# Patient Record
Sex: Female | Born: 1970 | Race: Black or African American | Hispanic: No | Marital: Married | Smoking: Never smoker
Health system: Southern US, Community
[De-identification: ages and names within clinical notes are randomized; demographics above are authoritative.]

## PROBLEM LIST (undated history)

## (undated) DIAGNOSIS — I10 Essential (primary) hypertension: Secondary | ICD-10-CM

## (undated) DIAGNOSIS — H5500 Unspecified nystagmus: Secondary | ICD-10-CM

## (undated) HISTORY — PX: EYE SURGERY: SHX253

---

## 2018-06-02 ENCOUNTER — Encounter (HOSPITAL_BASED_OUTPATIENT_CLINIC_OR_DEPARTMENT_OTHER): Payer: Self-pay | Admitting: Emergency Medicine

## 2018-06-02 ENCOUNTER — Emergency Department (HOSPITAL_BASED_OUTPATIENT_CLINIC_OR_DEPARTMENT_OTHER)
Admission: EM | Admit: 2018-06-02 | Discharge: 2018-06-02 | Disposition: A | Discharge status: Home / Self Care | Payer: Managed Care, Other (non HMO) | Attending: Emergency Medicine | Admitting: Emergency Medicine

## 2018-06-02 ENCOUNTER — Other Ambulatory Visit: Payer: Self-pay

## 2018-06-02 ENCOUNTER — Emergency Department (HOSPITAL_BASED_OUTPATIENT_CLINIC_OR_DEPARTMENT_OTHER): Payer: Managed Care, Other (non HMO)

## 2018-06-02 DIAGNOSIS — W101XXA Fall (on)(from) sidewalk curb, initial encounter: Secondary | ICD-10-CM | POA: Insufficient documentation

## 2018-06-02 DIAGNOSIS — S20211A Contusion of right front wall of thorax, initial encounter: Secondary | ICD-10-CM | POA: Insufficient documentation

## 2018-06-02 DIAGNOSIS — Y999 Unspecified external cause status: Secondary | ICD-10-CM | POA: Diagnosis not present

## 2018-06-02 DIAGNOSIS — I10 Essential (primary) hypertension: Secondary | ICD-10-CM | POA: Insufficient documentation

## 2018-06-02 DIAGNOSIS — Y9301 Activity, walking, marching and hiking: Secondary | ICD-10-CM | POA: Insufficient documentation

## 2018-06-02 DIAGNOSIS — Y9259 Other trade areas as the place of occurrence of the external cause: Secondary | ICD-10-CM | POA: Diagnosis not present

## 2018-06-02 HISTORY — DX: Unspecified nystagmus: H55.00

## 2018-06-02 HISTORY — DX: Essential (primary) hypertension: I10

## 2018-06-02 MED ORDER — METHOCARBAMOL 500 MG PO TABS: 500.0000 mg | ORAL_TABLET | Freq: Two times a day (BID) | ORAL | 0 refills | Status: AC

## 2018-06-02 MED ORDER — KETOROLAC TROMETHAMINE 30 MG/ML IJ SOLN
30.0000 mg | Freq: Once | INTRAMUSCULAR | Status: AC
  Administered 2018-06-02: 30 mg via INTRAMUSCULAR
  Filled 2018-06-02: qty 1

## 2018-06-02 MED ORDER — NAPROXEN 500 MG PO TABS: 500.0000 mg | ORAL_TABLET | Freq: Two times a day (BID) | ORAL | 0 refills | Status: AC

## 2018-06-02 NOTE — Discharge Instructions (Addendum)
Use ice 3-4 times daily alternating 20 minutes on, 20 minutes off.  Take Naprosyn twice daily as prescribed.  You can alternate with Tylenol as prescribed over-the-counter, as needed for your pain.  Take Robaxin twice daily as needed for muscle pain or spasms.  Do not drive or operate machinery while taking this medication.  Please follow-up with your doctor on returning home if your symptoms are not improving.  Please return the emergency department if you develop any new or worsening symptoms including significant bruising or pain in your abdomen or obvious blood in your urine.

## 2018-06-02 NOTE — ED Triage Notes (Signed)
Patient was at mall and missed curb and fell flat on her stomach and chest. Patient states she had on an undergarment that pressed into her lower ribs on the right side and has both internal and external pain in that area. No other symptoms are present.

## 2018-06-02 NOTE — ED Provider Notes (Signed)
[facility] EMERGENCY DEPARTMENT Provider Note   CSN: [id number] Arrival date & time: 06/02/18  1825     History   Chief Complaint Chief Complaint  Patient presents with  . Fall    HPI Regina Ford is a 47 y.o. female with history of hypertension who presents with right rib pain after fall.  Patient reports she tripped and [place] the mall.  She did not hit her head or lose consciousness.  She ended up lying on her chest and abdomen.  She has pain under her right breast when she moves.  She denies any pain without moving or at rest.  She denies any hematuria or abdominal pain at rest.  She denies any nausea, vomiting.  She is not anticoagulated.  She did not take any medication prior to arrival.  HPI  Past Medical History:  Diagnosis Date  . Hypertension   . Nystagmus     There are no active problems to display for this patient.   Past Surgical History:  Procedure Laterality Date  . EYE SURGERY       OB History    Gravida  2   Para  2   Term  2   Preterm      AB      Living  2     SAB      TAB      Ectopic      Multiple      Live Births  2            Home Medications    Prior to Admission medications   Medication Sig Start Date End Date Taking? Authorizing Provider  methocarbamol (ROBAXIN) 500 MG tablet Take 1 tablet (500 mg total) by mouth 2 (two) times daily. 06/02/18   Marciano Mundt, Waylan Boga, PA-C  naproxen (NAPROSYN) 500 MG tablet Take 1 tablet (500 mg total) by mouth 2 (two) times daily. 06/02/18   Emi Holes, PA-C    Family History History reviewed. No pertinent family history.  Social History Social History   Tobacco Use  . Smoking status: Never Smoker  . Smokeless tobacco: Never Used  Substance Use Topics  . Alcohol use: Never    Frequency: Never  . Drug use: Never     Allergies   Patient has no known allergies.   Review of Systems Review of Systems  Constitutional: Negative for chills and  fever.  HENT: Negative for facial swelling and sore throat.   Respiratory: Negative for shortness of breath.   Cardiovascular: Negative for chest pain.  Gastrointestinal: Negative for abdominal pain, nausea and vomiting.  Genitourinary: Negative for hematuria.  Musculoskeletal: Positive for myalgias. Negative for back pain and neck pain.  Skin: Negative for rash and wound.  Neurological: Negative for headaches.  Psychiatric/Behavioral: The patient is not nervous/anxious.      Physical Exam Updated Vital Signs BP (!) 151/94 (BP Location: Left Arm)   Pulse 100   Temp 98 F (36.7 C) (Oral)   Resp 19   Ht 5\' 8"  (1.727 m)   Wt 122.5 kg   LMP  (LMP Unknown)   SpO2 100%   BMI 41.05 kg/m   Physical Exam  Constitutional: She appears well-developed and well-nourished. No distress.  HENT:  Head: Normocephalic and atraumatic.  Mouth/Throat: Oropharynx is clear and moist. No oropharyngeal exudate.  Eyes: Pupils are equal, round, and reactive to light. Conjunctivae are normal. Right eye exhibits no discharge. Left eye  exhibits no discharge. No scleral icterus.  Neck: Normal range of motion. Neck supple. No thyromegaly present.  Cardiovascular: Normal rate, regular rhythm, normal heart sounds and intact distal pulses. Exam reveals no gallop and no friction rub.  No murmur heard. Pulmonary/Chest: Effort normal and breath sounds normal. No stridor. No respiratory distress. She has no wheezes. She has no rales. She exhibits tenderness.  Point tenderness to lowest rib No ecchymosis    Abdominal: Soft. Bowel sounds are normal. She exhibits no distension. There is no tenderness. There is no rebound and no guarding.  No ecchymosis  Musculoskeletal: She exhibits no edema.  No midline cervical, thoracic, or lumbar tenderness  Lymphadenopathy:    She has no cervical adenopathy.  Neurological: She is alert. Coordination normal.  Equal bilateral grip strength, sensation intact bilaterally, 5/5  strength to all 4 extremity  Skin: Skin is warm and dry. No rash noted. She is not diaphoretic. No pallor.  Psychiatric: She has a normal mood and affect.  Nursing note and vitals reviewed.    ED Treatments / Results  Labs (all labs ordered are listed, but only abnormal results are displayed) Labs Reviewed - No data to display  EKG None  Radiology Dg Ribs Unilateral W/chest Right  Result Date: 06/02/2018 CLINICAL DATA:  Fall.  Right rib pain. EXAM: RIGHT RIBS AND CHEST - 3+ VIEW COMPARISON:  None. FINDINGS: No fracture or other bone lesions are seen involving the ribs. There is no evidence of pneumothorax or pleural effusion. Both lungs are clear. Heart size and mediastinal contours are within normal limits. IMPRESSION: Negative. Electronically Signed   By: Gerome Sam III M.D   On: 06/02/2018 19:35    Procedures Procedures (including critical care time)  Medications Ordered in ED Medications  ketorolac (TORADOL) 30 MG/ML injection 30 mg (30 mg Intramuscular Given 06/02/18 1952)     Initial Impression / Assessment and Plan / ED Course  I have reviewed the triage vital signs and the nursing notes.  Pertinent labs & imaging results that were available during my care of the patient were reviewed by me and considered in my medical decision making (see chart for details).     Patient presenting with right lower rib pain after fall.  Right rib x-ray is negative.  Patient has no chest pain or shortness of breath.  Her pain is worse only with movement.  She has no abdominal tenderness or ecchymosis.  Will cover with incentive spirometer.  Supportive treatment including ice, Naprosyn, Robaxin advised.  Toradol given in the ED.  Follow-up to PCP as needed.  Strict return precautions discussed.  Patient understands and agrees with plan.  Vitals stable throughout ED course and discharged in satisfactory condition.  Final Clinical Impressions(s) / ED Diagnoses   Final diagnoses:    Contusion of rib on right side, initial encounter    ED Discharge Orders         Ordered    naproxen (NAPROSYN) 500 MG tablet  2 times daily     06/02/18 1946    methocarbamol (ROBAXIN) 500 MG tablet  2 times daily     06/02/18 1946           Emi Holes, PA-C 06/02/18 2025    Gwyneth Sprout, MD 06/03/18 2156

## 2019-12-04 IMAGING — CR DG RIBS W/ CHEST 3+V*R*
3 series · 3 of 3 positions shown · non-contrast
Comparison: None.

CLINICAL DATA: Fall.  Right rib pain.

EXAM:
RIGHT RIBS AND CHEST - 3+ VIEW

[w chest pa]
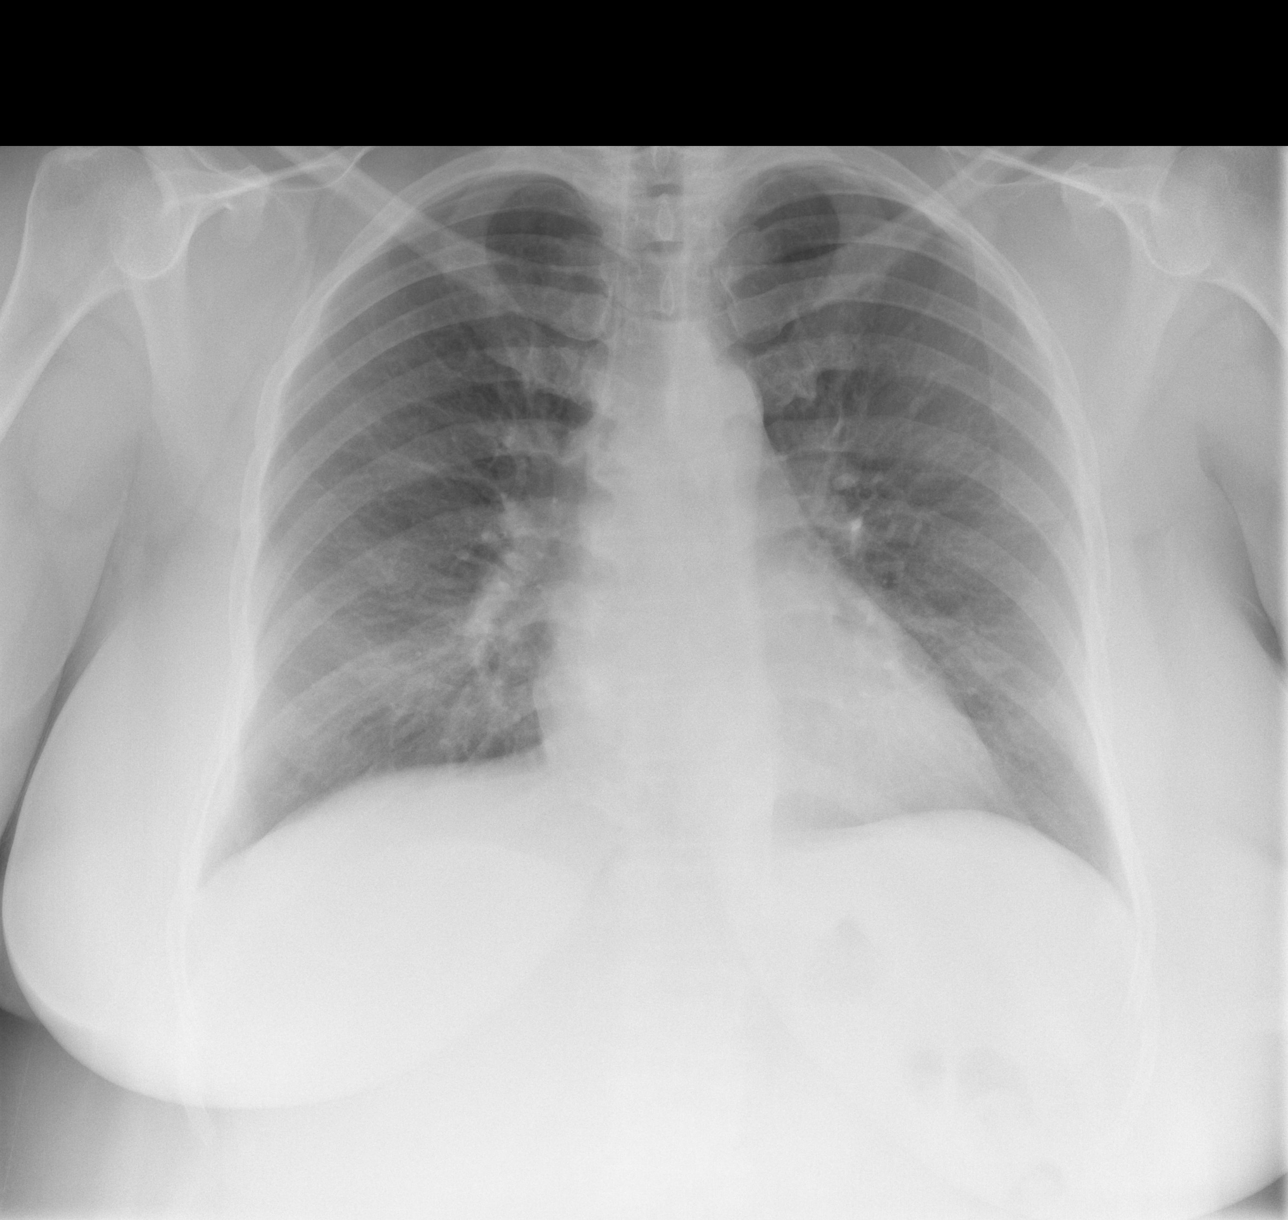

[w ribs ap/pa upper right]
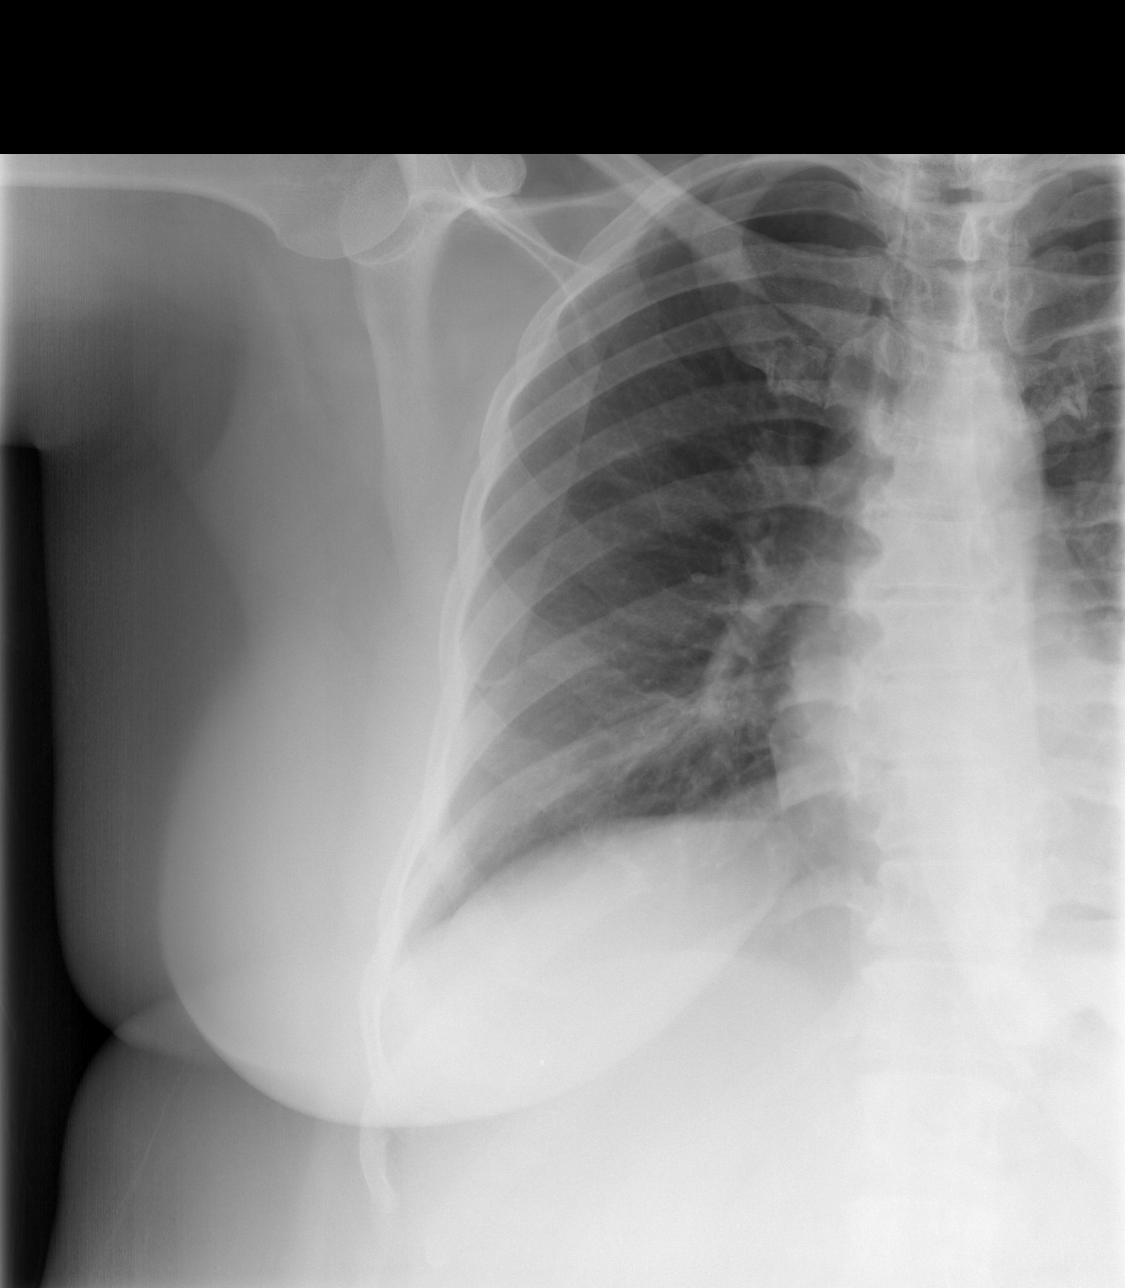

[w ribs oblique right]
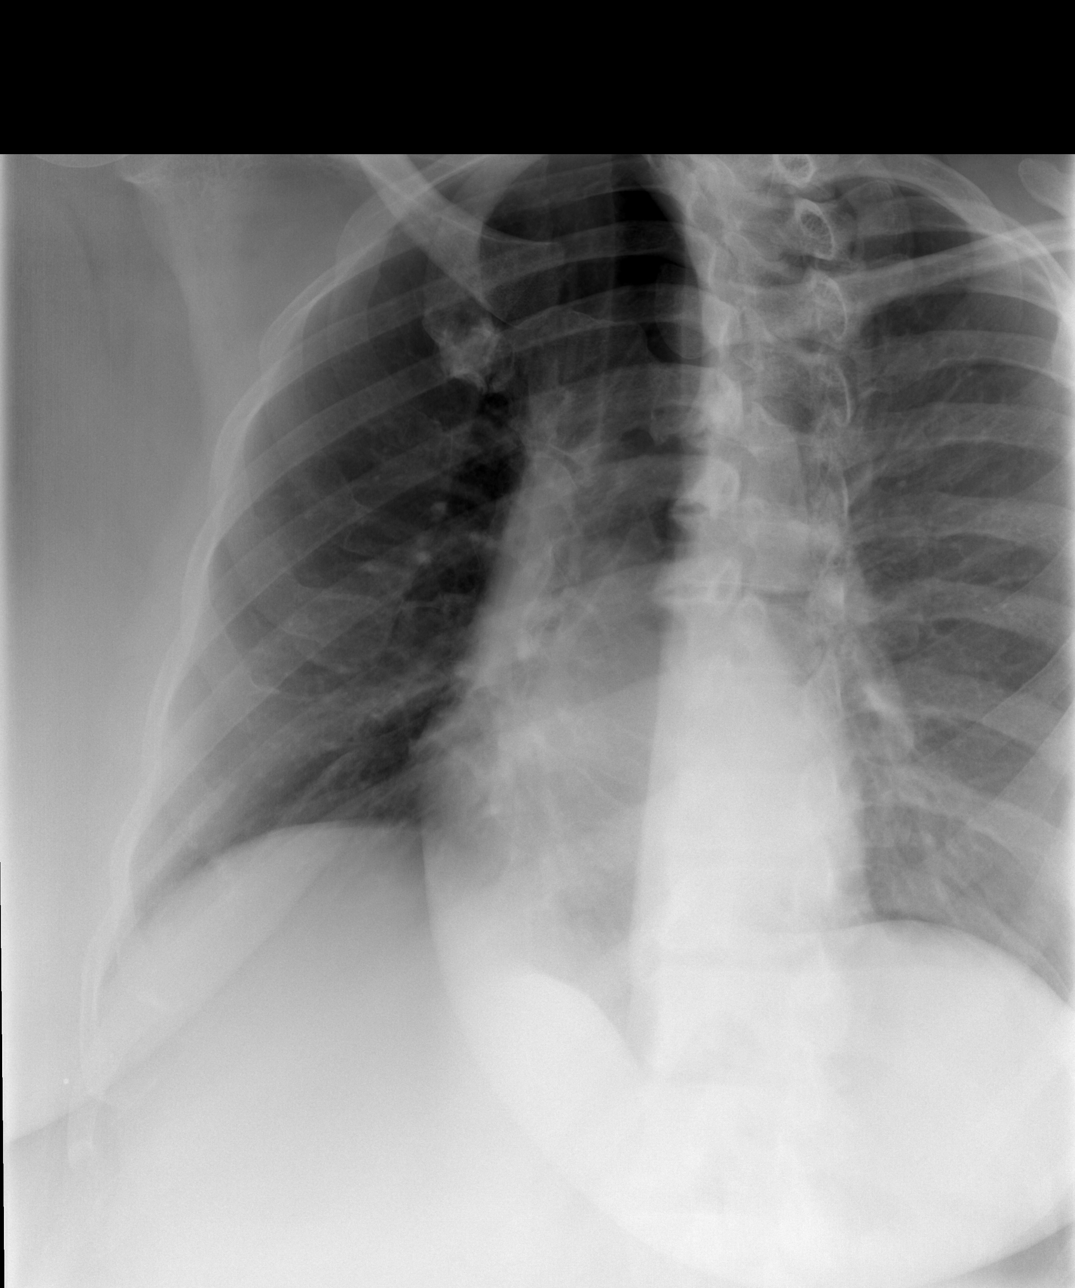

[3 of 3 positions shown; findings below may reference images not displayed]

FINDINGS: No fracture or other bone lesions are seen involving the ribs. There
is no evidence of pneumothorax or pleural effusion. Both lungs are
clear. Heart size and mediastinal contours are within normal limits.
IMPRESSION: Negative.
